# Patient Record
Sex: Male | Born: 1990
Health system: Southern US, Community
[De-identification: ages and names within clinical notes are randomized; demographics above are authoritative.]

## PROBLEM LIST (undated history)

## (undated) DIAGNOSIS — IMO0001 Reserved for inherently not codable concepts without codable children: Secondary | ICD-10-CM

---
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
## 2010-12-04 ENCOUNTER — Emergency Department (HOSPITAL_COMMUNITY)
Admission: EM | Admit: 2010-12-04 | Discharge: 2010-12-04 | Payer: Self-pay | Source: Home / Self Care | Admitting: Family Medicine

## 2012-03-16 ENCOUNTER — Emergency Department (HOSPITAL_COMMUNITY): Payer: 59

## 2012-03-16 ENCOUNTER — Emergency Department (HOSPITAL_COMMUNITY)
Admission: EM | Admit: 2012-03-16 | Discharge: 2012-03-16 | Disposition: A | Discharge status: Home / Self Care | Payer: 59 | Attending: Emergency Medicine | Admitting: Emergency Medicine

## 2012-03-16 ENCOUNTER — Encounter (HOSPITAL_COMMUNITY): Payer: Self-pay | Admitting: Emergency Medicine

## 2012-03-16 DIAGNOSIS — M25529 Pain in unspecified elbow: Secondary | ICD-10-CM | POA: Insufficient documentation

## 2012-03-16 DIAGNOSIS — S0081XA Abrasion of other part of head, initial encounter: Secondary | ICD-10-CM

## 2012-03-16 DIAGNOSIS — R079 Chest pain, unspecified: Secondary | ICD-10-CM | POA: Insufficient documentation

## 2012-03-16 DIAGNOSIS — M25429 Effusion, unspecified elbow: Secondary | ICD-10-CM | POA: Insufficient documentation

## 2012-03-16 DIAGNOSIS — IMO0001 Reserved for inherently not codable concepts without codable children: Secondary | ICD-10-CM | POA: Insufficient documentation

## 2012-03-16 DIAGNOSIS — IMO0002 Reserved for concepts with insufficient information to code with codable children: Secondary | ICD-10-CM | POA: Insufficient documentation

## 2012-03-16 DIAGNOSIS — M25519 Pain in unspecified shoulder: Secondary | ICD-10-CM | POA: Insufficient documentation

## 2012-03-16 DIAGNOSIS — S060X1A Concussion with loss of consciousness of 30 minutes or less, initial encounter: Secondary | ICD-10-CM | POA: Insufficient documentation

## 2012-03-16 DIAGNOSIS — F10929 Alcohol use, unspecified with intoxication, unspecified: Secondary | ICD-10-CM

## 2012-03-16 DIAGNOSIS — F1010 Alcohol abuse, uncomplicated: Secondary | ICD-10-CM | POA: Insufficient documentation

## 2012-03-16 DIAGNOSIS — R51 Headache: Secondary | ICD-10-CM | POA: Insufficient documentation

## 2012-03-16 HISTORY — DX: Reserved for inherently not codable concepts without codable children: IMO0001

## 2012-03-16 LAB — CBC
HCT: 44.8 % (ref 39.0–52.0)
Hemoglobin: 16.3 g/dL (ref 13.0–17.0)
MCH: 31.1 pg (ref 26.0–34.0)
MCHC: 36.4 g/dL — ABNORMAL HIGH (ref 30.0–36.0)
MCV: 85.5 fL (ref 78.0–100.0)
RBC: 5.24 MIL/uL (ref 4.22–5.81)
RDW: 12.5 % (ref 11.5–15.5)
WBC: 12 10*3/uL — ABNORMAL HIGH (ref 4.0–10.5)

## 2012-03-16 LAB — DIFFERENTIAL
Basophils Absolute: 0 10*3/uL (ref 0.0–0.1)
Basophils Relative: 0 % (ref 0–1)
Eosinophils Absolute: 0 10*3/uL (ref 0.0–0.7)
Eosinophils Relative: 0 % (ref 0–5)
Lymphocytes Relative: 15 % (ref 12–46)
Monocytes Absolute: 0.7 10*3/uL (ref 0.1–1.0)
Monocytes Relative: 6 % (ref 3–12)
Neutro Abs: 9.5 10*3/uL — ABNORMAL HIGH (ref 1.7–7.7)
Neutrophils Relative %: 79 % — ABNORMAL HIGH (ref 43–77)

## 2012-03-16 LAB — BASIC METABOLIC PANEL
CO2: 24 mEq/L (ref 19–32)
Chloride: 99 mEq/L (ref 96–112)
Creatinine, Ser: 0.83 mg/dL (ref 0.50–1.35)
GFR calc Af Amer: >90 mL/min (ref >90)
Glucose, Bld: 100 mg/dL — ABNORMAL HIGH (ref 70–99)
Potassium: 3.4 mEq/L — ABNORMAL LOW (ref 3.5–5.1)
Sodium: 135 mEq/L (ref 135–145)

## 2012-03-16 LAB — ETHANOL: Alcohol, Ethyl (B): 140 mg/dL — ABNORMAL HIGH (ref 0–11)

## 2012-03-16 MED ORDER — ONDANSETRON HCL 4 MG/2ML IJ SOLN
4.0000 mg | Freq: Once | INTRAMUSCULAR | Status: AC
  Administered 2012-03-16: 4 mg via INTRAVENOUS
  Filled 2012-03-16: qty 2

## 2012-03-16 MED ORDER — FENTANYL CITRATE 0.05 MG/ML IJ SOLN
25.0000 ug | Freq: Once | INTRAMUSCULAR | Status: AC
  Administered 2012-03-16: 08:00:00 via INTRAVENOUS
  Filled 2012-03-16: qty 2

## 2012-03-16 MED ORDER — IOHEXOL 300 MG/ML  SOLN
100.0000 mL | Freq: Once | INTRAMUSCULAR | Status: AC | PRN
  Administered 2012-03-16: 100 mL via INTRAVENOUS

## 2012-03-16 MED ORDER — HYDROMORPHONE HCL PF 1 MG/ML IJ SOLN
0.5000 mg | Freq: Once | INTRAMUSCULAR | Status: AC
  Administered 2012-03-16: 09:00:00 via INTRAVENOUS
  Filled 2012-03-16: qty 1

## 2012-03-16 MED ORDER — CYCLOBENZAPRINE HCL 10 MG PO TABS: 10.0000 mg | ORAL_TABLET | Freq: Two times a day (BID) | ORAL | Status: AC | PRN

## 2012-03-16 MED ORDER — SODIUM CHLORIDE 0.9 % IV BOLUS (SEPSIS)
1000.0000 mL | Freq: Once | INTRAVENOUS | Status: AC
  Administered 2012-03-16: 1000 mL via INTRAVENOUS

## 2012-03-16 MED ORDER — IBUPROFEN 600 MG PO TABS: 600.0000 mg | ORAL_TABLET | Freq: Four times a day (QID) | ORAL | Status: AC | PRN

## 2012-03-16 NOTE — ED Notes (Addendum)
Pt given water to drink. Verified with PA.

## 2012-03-16 NOTE — ED Notes (Signed)
Patient remains in CT and Xray at this time
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
## 2012-03-16 NOTE — ED Notes (Signed)
Enter patient room and noticed C-collar located at foot of bed.  Patient stated that he removed it himself.  Head of bed elevated at this time.  RN Pati Gallo informed and PA B. Laveda Norman informed.  Family remains at bedside; patient tearful at this time; Patient reports pain in left shoulder and bilateral knees at this time

## 2012-03-16 NOTE — ED Provider Notes (Signed)
Medical screening examination/treatment/procedure(s) were performed by non-physician practitioner and as supervising physician I was immediately available for consultation/collaboration.  Sharrie Self K Meshawn Oconnor-Rasch, MD 03/16/12 [id number] 
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
## 2012-03-16 NOTE — ED Notes (Signed)
Patient transported to CT and X ray 

## 2012-03-16 NOTE — ED Notes (Signed)
PA in to turn patient and take off of back board; C-collar remains in place

## 2012-03-16 NOTE — ED Provider Notes (Signed)
Medical screening examination/treatment/procedure(s) were performed by non-physician practitioner and as supervising physician I was immediately available for consultation/collaboration.  Jasmine Awe, MD 03/16/12 [phone]
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
## 2012-03-16 NOTE — ED Notes (Signed)
Patient back from CT; currently resting quietly in bed; no respiratory or acute distress noted.  Family and [employer] now present at bedside.  Will continue to monitor.
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
## 2018-10-30 ENCOUNTER — Ambulatory Visit (INDEPENDENT_AMBULATORY_CARE_PROVIDER_SITE_OTHER): Payer: BLUE CROSS/BLUE SHIELD | Admitting: Family Medicine

## 2018-10-30 ENCOUNTER — Encounter: Payer: Self-pay | Admitting: Family Medicine

## 2018-10-30 ENCOUNTER — Other Ambulatory Visit: Payer: Self-pay

## 2018-10-30 VITALS — BP 118/64 | HR 56 | Temp 98.5°F | Resp 14 | Ht 73.0 in | Wt 168.0 lb

## 2018-10-30 DIAGNOSIS — G8929 Other chronic pain: Secondary | ICD-10-CM

## 2018-10-30 DIAGNOSIS — Z23 Encounter for immunization: Secondary | ICD-10-CM | POA: Diagnosis not present

## 2018-10-30 DIAGNOSIS — R252 Cramp and spasm: Secondary | ICD-10-CM | POA: Diagnosis not present

## 2018-10-30 DIAGNOSIS — M545 Low back pain, unspecified: Secondary | ICD-10-CM

## 2018-10-30 DIAGNOSIS — Z0000 Encounter for general adult medical examination without abnormal findings: Secondary | ICD-10-CM

## 2018-10-30 DIAGNOSIS — Z114 Encounter for screening for human immunodeficiency virus [HIV]: Secondary | ICD-10-CM | POA: Diagnosis not present

## 2018-10-30 DIAGNOSIS — Z87820 Personal history of traumatic brain injury: Secondary | ICD-10-CM

## 2018-10-30 DIAGNOSIS — Z202 Contact with and (suspected) exposure to infections with a predominantly sexual mode of transmission: Secondary | ICD-10-CM | POA: Diagnosis not present

## 2018-10-30 DIAGNOSIS — Z13220 Encounter for screening for lipoid disorders: Secondary | ICD-10-CM | POA: Diagnosis not present

## 2018-10-30 NOTE — Patient Instructions (Signed)
We will set you up for xray I recommend dental visit every 6 months Goal is to  Exercise 30 minutes 5 days a week We will send a letter with lab results  Tetantus booster given F/U pending results
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
## 2018-10-31 LAB — LIPID PANEL
CHOL/HDL RATIO: 2 (calc) (ref <5.0)
CHOLESTEROL: 125 mg/dL (ref <200)
HDL: 61 mg/dL (ref >40)
LDL Cholesterol (Calc): 49 mg/dL (calc)
Non-HDL Cholesterol (Calc): 64 mg/dL (calc) (ref <130)
TRIGLYCERIDES: 71 mg/dL (ref <150)

## 2018-10-31 LAB — COMPREHENSIVE METABOLIC PANEL
AG RATIO: 1.9 (calc) (ref 1.0–2.5)
ALBUMIN MSPROF: 4.8 g/dL (ref 3.6–5.1)
ALKALINE PHOSPHATASE (APISO): 78 U/L (ref 40–115)
ALT: 24 U/L (ref 9–46)
AST: 27 U/L (ref 10–40)
BUN: 24 mg/dL (ref 7–25)
CHLORIDE: 102 mmol/L (ref 98–110)
CO2: 30 mmol/L (ref 20–32)
Calcium: 9.8 mg/dL (ref 8.6–10.3)
Creat: 0.97 mg/dL (ref 0.60–1.35)
GLOBULIN: 2.5 g/dL (ref 1.9–3.7)
Glucose, Bld: 80 mg/dL (ref 65–99)
POTASSIUM: 5.1 mmol/L (ref 3.5–5.3)
SODIUM: 139 mmol/L (ref 135–146)
Total Bilirubin: 0.7 mg/dL (ref 0.2–1.2)
Total Protein: 7.3 g/dL (ref 6.1–8.1)

## 2018-10-31 LAB — CBC WITH DIFFERENTIAL/PLATELET
Basophils Absolute: 51 cells/uL (ref 0–200)
Basophils Relative: 1.1 %
Eosinophils Absolute: 78 cells/uL (ref 15–500)
Eosinophils Relative: 1.7 %
HCT: 49.1 % (ref 38.5–50.0)
Hemoglobin: 17.1 g/dL (ref 13.2–17.1)
Lymphs Abs: 1509 cells/uL (ref 850–3900)
MCH: 29.8 pg (ref 27.0–33.0)
MCHC: 34.8 g/dL (ref 32.0–36.0)
MCV: 85.7 fL (ref 80.0–100.0)
MPV: 10.4 fL (ref 7.5–12.5)
Monocytes Relative: 9.5 %
Neutro Abs: 2525 cells/uL (ref 1500–7800)
Neutrophils Relative %: 54.9 %
Platelets: 260 10*3/uL (ref 140–400)
RBC: 5.73 10*6/uL (ref 4.20–5.80)
RDW: 12.7 % (ref 11.0–15.0)
Total Lymphocyte: 32.8 %
WBC mixed population: 437 cells/uL (ref 200–950)
WBC: 4.6 10*3/uL (ref 3.8–10.8)

## 2018-10-31 LAB — HIV ANTIBODY (ROUTINE TESTING W REFLEX): HIV 1&2 Ab, 4th Generation: NONREACTIVE

## 2018-10-31 LAB — MAGNESIUM: MAGNESIUM: 2.2 mg/dL (ref 1.5–2.5)

## 2018-10-31 NOTE — Addendum Note (Signed)
Addended by: Milinda AntisURHAM, KAWANTA F on: 10/31/2018 08:16 AM   Modules accepted: Orders

## 2018-11-01 ENCOUNTER — Encounter: Payer: Self-pay | Admitting: Family Medicine

## 2019-10-27 ENCOUNTER — Other Ambulatory Visit: Payer: Self-pay

## 2019-10-27 DIAGNOSIS — Z20822 Contact with and (suspected) exposure to covid-19: Secondary | ICD-10-CM

## 2019-10-28 LAB — NOVEL CORONAVIRUS, NAA: SARS-CoV-2, NAA: NOT DETECTED

## 2019-12-10 ENCOUNTER — Encounter (HOSPITAL_COMMUNITY): Payer: Self-pay | Admitting: Emergency Medicine

## 2019-12-10 ENCOUNTER — Emergency Department (HOSPITAL_COMMUNITY)
Admission: EM | Admit: 2019-12-10 | Discharge: 2019-12-10 | Disposition: A | Discharge status: Home / Self Care | Payer: 59 | Attending: Emergency Medicine | Admitting: Emergency Medicine

## 2019-12-10 ENCOUNTER — Other Ambulatory Visit: Payer: Self-pay

## 2019-12-10 ENCOUNTER — Emergency Department (HOSPITAL_COMMUNITY): Payer: 59

## 2019-12-10 DIAGNOSIS — J029 Acute pharyngitis, unspecified: Secondary | ICD-10-CM

## 2019-12-10 DIAGNOSIS — Y93H3 Activity, building and construction: Secondary | ICD-10-CM | POA: Diagnosis not present

## 2019-12-10 DIAGNOSIS — Y990 Civilian activity done for income or pay: Secondary | ICD-10-CM | POA: Insufficient documentation

## 2019-12-10 DIAGNOSIS — W278XXA Contact with other nonpowered hand tool, initial encounter: Secondary | ICD-10-CM | POA: Insufficient documentation

## 2019-12-10 DIAGNOSIS — T189XXA Foreign body of alimentary tract, part unspecified, initial encounter: Secondary | ICD-10-CM | POA: Diagnosis present

## 2019-12-10 DIAGNOSIS — F17290 Nicotine dependence, other tobacco product, uncomplicated: Secondary | ICD-10-CM | POA: Diagnosis not present

## 2019-12-10 DIAGNOSIS — R070 Pain in throat: Secondary | ICD-10-CM | POA: Diagnosis not present

## 2019-12-10 DIAGNOSIS — Y9261 Building [any] under construction as the place of occurrence of the external cause: Secondary | ICD-10-CM | POA: Insufficient documentation

## 2019-12-10 DIAGNOSIS — Z79899 Other long term (current) drug therapy: Secondary | ICD-10-CM | POA: Diagnosis not present

## 2019-12-10 MED ORDER — LIDOCAINE VISCOUS HCL 2 % MT SOLN
15.0000 mL | Freq: Once | OROMUCOSAL | Status: AC
  Administered 2019-12-10: 15 mL via OROMUCOSAL
  Filled 2019-12-10: qty 15

## 2019-12-10 NOTE — ED Triage Notes (Signed)
Pt accidentally swallowed a 1 inch roofing nail that he was holding in his teeth just pta.  Reports R sided facial tingling PTA that has resolved.  States he thinks he was anxious.  Also reports throat pain.

## 2019-12-10 NOTE — ED Notes (Signed)
Dr.Plunkett notified of pt and orders received. 

## 2019-12-10 NOTE — ED Provider Notes (Signed)
[facility] EMERGENCY DEPARTMENT Provider Note   CSN: [id number] Arrival date & time: 12/10/19  1616     History Chief Complaint  Patient presents with  . Swallowed Foreign Body    Edward Rice is a 29 y.o. male.  Edward Rice is a 29 y.o. male who is otherwise healthy, presents to the ED after he accidentally swallowed a 1 inch roofing nail.  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  He immediately tried to reach back into his throat and get it and then made himself vomit several times but he is not sure if he saw the nail in his vomit.  He then became very anxious and concerned, noted some tingling in his face on the way over here but this is resolved and he thinks it was primarily related to anxiety.  He states that his throat is still a bit sore.  He did not notice any blood in his vomit and he denies any chest pain, shortness of breath or abdominal pain.  No other aggravating or alleviating factors.        Past Medical History:  Diagnosis Date  . No significant past medical history     Patient Active Problem List   Diagnosis Date Noted  . No significant past medical history     History reviewed. No pertinent surgical history.     Family History  Problem Relation Age of Onset  . Diabetes Mother   . ADD / ADHD Father   . Alcohol abuse Father   . Anxiety disorder Father   . Arthritis Father   . Cancer Father 2       prostate  . Depression Father   . Vision loss Father   . Graves' disease Father   . Hyperlipidemia Father   . Anxiety disorder Sister     Social History   Tobacco Use  . Smoking status: Never Smoker  . Smokeless tobacco: Current User    Types: Chew  Substance Use Topics  . Alcohol use: Not Currently  . Drug use: Never    Home Medications Prior to Admission medications   Medication Sig  Start Date End Date Taking? Authorizing Provider  ferrous sulfate 325 (65 FE) MG tablet Take 325 mg by mouth daily with breakfast.    [provider]  Potassium 99 MG TABS Take by mouth.    [provider]  vitamin C (ASCORBIC ACID) 500 MG tablet Take 500 mg by mouth daily.    [provider]    Allergies    Penicillins  Review of Systems   Review of Systems  Constitutional: Negative for chills and fever.  HENT: Positive for sore throat. Negative for trouble swallowing.   Respiratory: Negative for cough and shortness of breath.   Cardiovascular: Negative for chest pain.  Gastrointestinal: Negative for abdominal pain.  Skin: Negative for color change and rash.  Psychiatric/Behavioral: The patient is nervous/anxious.     Physical Exam Updated Vital Signs BP (!) 131/58 (BP Location: Right Arm)   Pulse 66   Temp 98.1 F (36.7 C) (Oral)   Resp 14   SpO2 97%   Physical Exam Vitals and nursing note reviewed.  Constitutional:      General: He is not in acute distress.    Appearance: Normal appearance. He is well-developed and normal weight. He  is not diaphoretic.     Comments: Well-appearing and in no distress  HENT:     Head: Normocephalic and atraumatic.     Mouth/Throat:     Mouth: Mucous membranes are moist.     Pharynx: Oropharynx is clear. Posterior oropharyngeal erythema present.     Comments: Posterior oropharynx is clear, mild erythema noted, no swelling, able to speak with normal phonation, tolerating secretions without difficulty Eyes:     General:        Right eye: No discharge.        Left eye: No discharge.  Neck:     Comments: No stridor Cardiovascular:     Rate and Rhythm: Normal rate and regular rhythm.     Pulses: Normal pulses.     Heart sounds: Normal heart sounds.  Pulmonary:     Effort: Pulmonary effort is normal. No respiratory distress.     Breath sounds: Normal breath sounds.     Comments: Respirations equal and  unlabored, patient able to speak in full sentences, lungs clear to auscultation bilaterally Abdominal:     General: Abdomen is flat. Bowel sounds are normal. There is no distension.     Palpations: Abdomen is soft. There is no mass.     Tenderness: There is no abdominal tenderness. There is no guarding.     Comments: Abdomen soft, nondistended, nontender to palpation in all quadrants without guarding or peritoneal signs  Musculoskeletal:     Cervical back: Neck supple.  Skin:    General: Skin is warm and dry.  Neurological:     Mental Status: He is alert.     Coordination: Coordination normal.  Psychiatric:        Mood and Affect: Mood normal.        Behavior: Behavior normal.     ED Results / Procedures / Treatments   Labs (all labs ordered are listed, but only abnormal results are displayed) Labs Reviewed - No data to display  EKG None  Radiology DG Neck Soft Tissue  Result Date: 12/10/2019 CLINICAL DATA:  Swallowed 1 inj roofing nail. EXAM: NECK SOFT TISSUES - 1+ VIEW COMPARISON:  None. FINDINGS: There is no evidence of retropharyngeal soft tissue swelling or epiglottic enlargement. The cervical airway is unremarkable and no radio-opaque foreign body identified. IMPRESSION: Negative. Electronically Signed   By: Virgina Norfolk M.D.   On: 12/10/2019 17:06   DG Chest 1 View  Result Date: 12/10/2019 CLINICAL DATA:  Swallowed 1 in true male. EXAM: CHEST  1 VIEW COMPARISON:  None. FINDINGS: The heart size and mediastinal contours are within normal limits. Both lungs are clear. The visualized skeletal structures are unremarkable. IMPRESSION: No active disease. Electronically Signed   By: Virgina Norfolk M.D.   On: 12/10/2019 17:07   DG Abdomen 1 View  Result Date: 12/10/2019 CLINICAL DATA:  Swallowed 1 intra roofing nail. EXAM: ABDOMEN - 1 VIEW COMPARISON:  None. FINDINGS: The bowel gas pattern is normal. There is no evidence of free air. No radio-opaque calculi or other  significant radiographic abnormality are seen. IMPRESSION: Negative. Electronically Signed   By: Virgina Norfolk M.D.   On: 12/10/2019 17:07    Procedures Procedures (including critical care time)  Medications Ordered in ED Medications  lidocaine (XYLOCAINE) 2 % viscous mouth solution 15 mL (has no administration in time range)    ED Course  I have reviewed the triage vital signs and the nursing notes.  Pertinent labs & imaging results that were available  during my care of the patient were reviewed by me and considered in my medical decision making (see chart for details).    MDM Rules/Calculators/A&P                     29 year old male presents after he accidentally swallowed a roofing nail, he made himself vomit immediately afterwards, currently complaining of mild sore throat but no other complaints.  He denies any chest pain or shortness of breath.  X-rays of the neck, chest and abdomen show no metallic foreign bodies, no free air noted to suggest perforation.  He has a small amount of erythema in the posterior oropharynx, from nail or vomiting likely, will treat with viscous lidocaine and warm salt water gargles, given reassuring imaging feel patient is stable for discharge home at this time.  Return precautions discussed.  Patient expresses understanding and agreement with plan.  Final Clinical Impression(s) / ED Diagnoses Final diagnoses:  Swallowed foreign body, initial encounter  Sore throat    Rx / DC Orders ED Discharge Orders    None       Legrand Rams 12/10/19 1837    Raeford Razor, MD 12/10/19 1905

## 2019-12-10 NOTE — ED Notes (Signed)
The pt swallowed a 1 inch roofing nail approx 1600   He haD PAIN IN HIS THROAT JUST AFTER  HE WAS ROOFING AND WAS HOLDING THE NAIL IN HIS MOUTH.  HE VOMITING SEVERAL TIMES AFTERWARD VOLUNTARILY   HE STILL HAS A SORE THROAT.

## 2019-12-10 NOTE — Discharge Instructions (Signed)
I'm reassured that her x-rays do not show any foreign body, and it appears that you vomited up prior to arrival.  You may continue to have some sore throat over the next 2 days use warm salt water gargles and's epical throat lozenges or Chloraseptic throat spray.  If you develop any worsening sore throat, chest or abdominal pain or any other new or concerning symptoms return to the ED for reevaluation.

## 2021-09-10 IMAGING — CR DG CHEST 1V
1 series · 1 of 1 positions shown · non-contrast
Comparison: None.
COMPARISON: None.

Addendum:
CLINICAL DATA: Swallowed 1 in true female.

EXAM:
CHEST  1 VIEW
CLINICAL DATA: Swallowed 1 inch roofing nail.
*** End of Addendum ***

[chest pa]
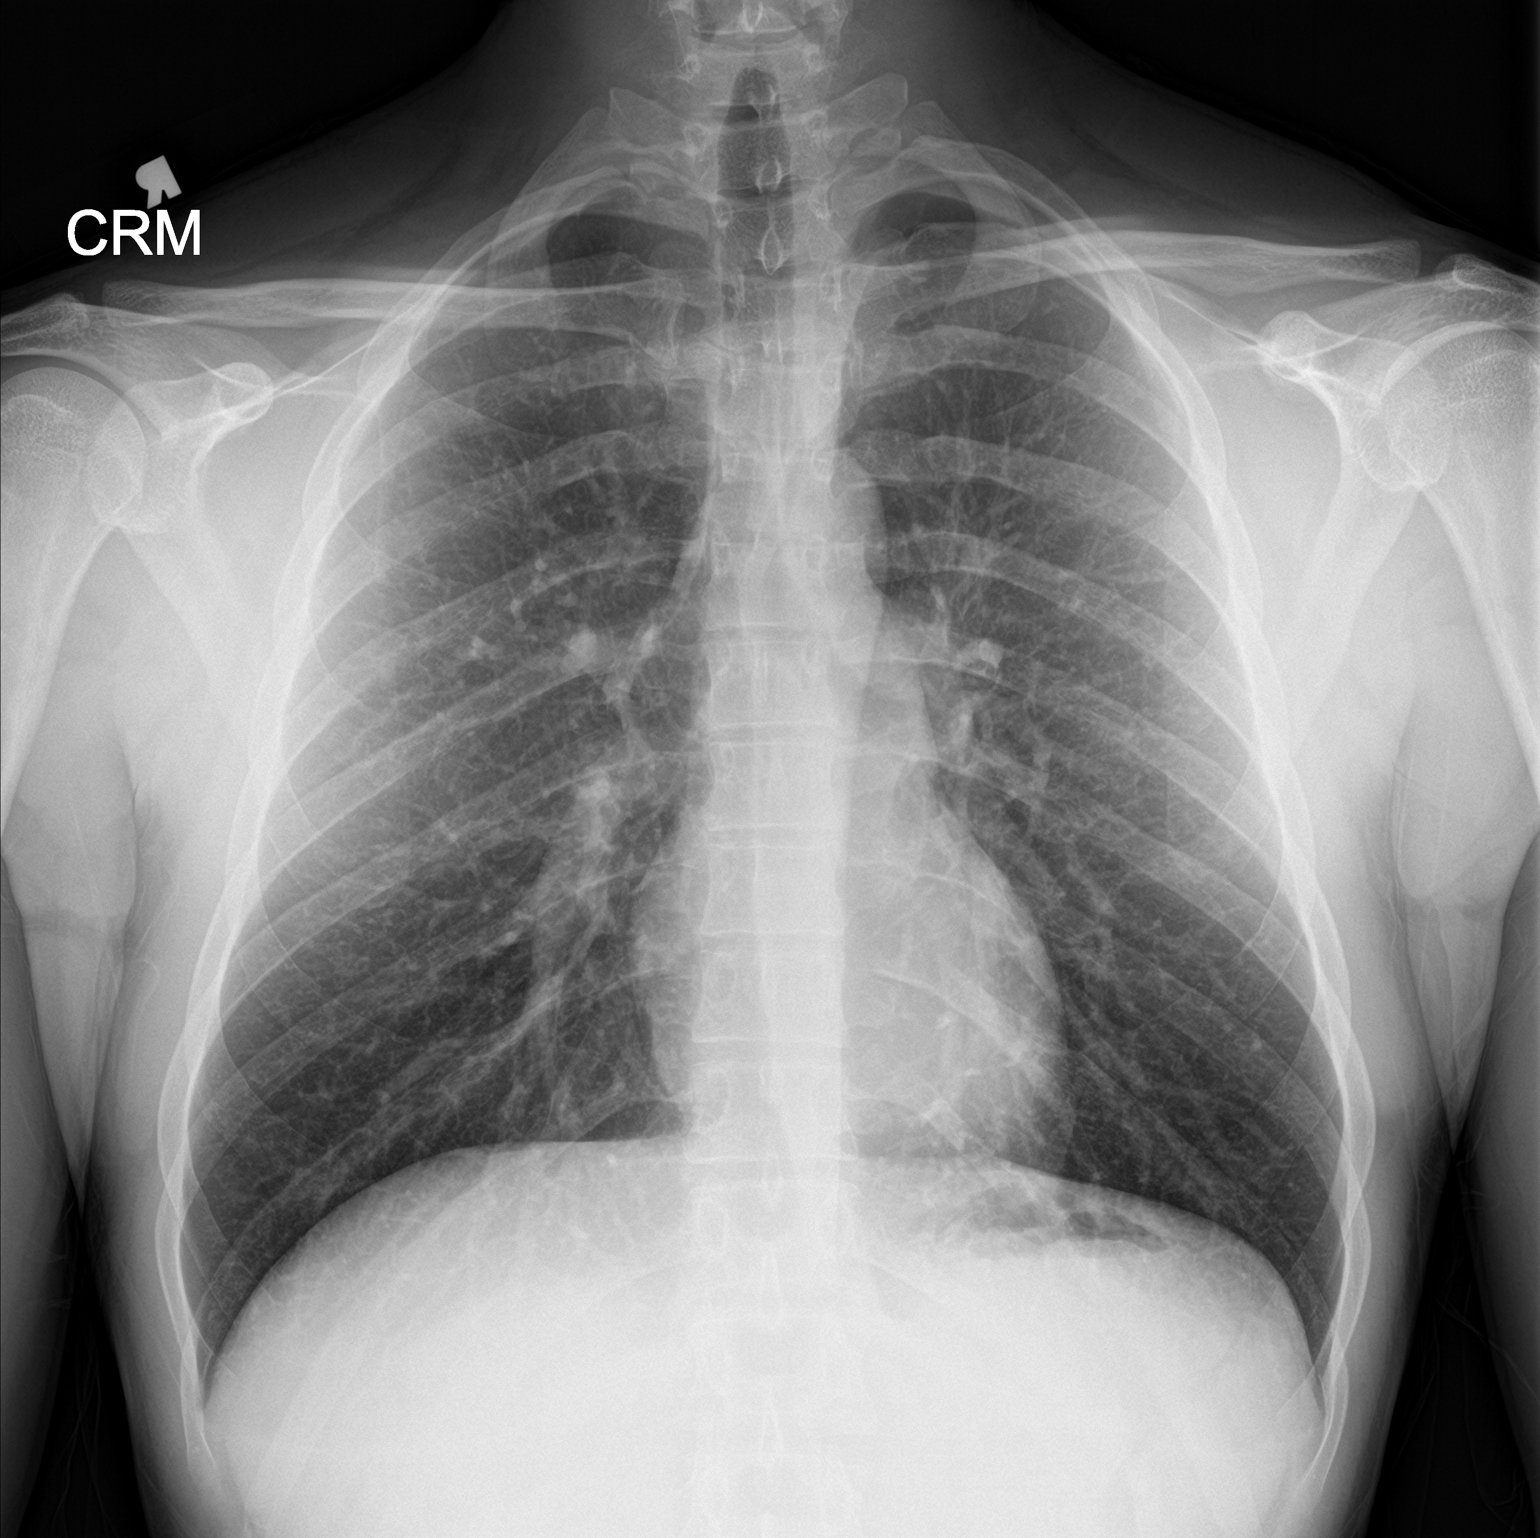

[1 of 1 positions shown; findings below may reference images not displayed]

FINDINGS: The heart size and mediastinal contours are within normal limits.
Both lungs are clear. The visualized skeletal structures are
unremarkable.
IMPRESSION: No active disease.

ADDENDUM:
FINDINGS: The heart size and mediastinal contours are within normal limits.
Both lungs are clear. The visualized skeletal structures are
unremarkable.
IMPRESSION: No active disease.

## 2022-08-27 ENCOUNTER — Encounter: Payer: Self-pay | Admitting: Emergency Medicine

## 2022-08-27 ENCOUNTER — Ambulatory Visit
Admission: EM | Admit: 2022-08-27 | Discharge: 2022-08-27 | Disposition: A | Discharge status: Home / Self Care | Payer: No Typology Code available for payment source | Attending: Family Medicine | Admitting: Family Medicine

## 2022-08-27 DIAGNOSIS — J209 Acute bronchitis, unspecified: Secondary | ICD-10-CM

## 2022-08-27 MED ORDER — PREDNISONE 20 MG PO TABS: 40.0000 mg | ORAL_TABLET | Freq: Every day | ORAL | 0 refills | Status: DC

## 2022-08-27 MED ORDER — PROMETHAZINE-DM 6.25-15 MG/5ML PO SYRP: 5.0000 mL | ORAL_SOLUTION | Freq: Four times a day (QID) | ORAL | 0 refills | Status: DC | PRN

## 2022-08-27 NOTE — ED Triage Notes (Signed)
Patient c/o non-productive cough x 6 days, some congestion, afebrile.  Patient denies any OTC meds.

## 2022-08-27 NOTE — ED Provider Notes (Signed)
[facility]    CSN: [id number] Arrival date & time: 08/27/22  1431      History   Chief Complaint Chief Complaint  Patient presents with   Cough    HPI Edward Rice is a 31 y.o. male.   Patient presenting today with almost a week of dry hacking cough, nasal congestion.  Denies fever, chills, chest pain, shortness of breath, abdominal pain, nausea vomiting or diarrhea.  So far not trying anything over-the-counter for symptoms.  States he used to have a history of seasonal allergies but does not anymore and does not take anything for this.  No chronic pulmonary disease.  No known sick contacts that he is aware of.    Past Medical History:  Diagnosis Date   No significant past medical history     Patient Active Problem List   Diagnosis Date Noted   No significant past medical history     History reviewed. No pertinent surgical history.     Home Medications    Prior to Admission medications   Medication Sig Start Date End Date Taking? Authorizing Provider  predniSONE (DELTASONE) 20 MG tablet Take 2 tablets (40 mg total) by mouth daily with breakfast. 08/27/22  Yes Volney American, PA-C  promethazine-dextromethorphan (PROMETHAZINE-DM) 6.25-15 MG/5ML syrup Take 5 mLs by mouth 4 (four) times daily as needed. 08/27/22  Yes Volney American, PA-C  ferrous sulfate 325 (65 FE) MG tablet Take 325 mg by mouth daily with breakfast.    [provider]  Potassium 99 MG TABS Take by mouth.    [provider]  vitamin C (ASCORBIC ACID) 500 MG tablet Take 500 mg by mouth daily.    [provider]    Family History Family History  Problem Relation Age of Onset   Diabetes Mother    ADD / ADHD Father    Alcohol abuse Father    Anxiety disorder Father    Arthritis Father    Cancer Father 70       prostate   Depression Father    Vision loss Father    Graves' disease Father    Hyperlipidemia Father    Anxiety disorder  Sister     Social History Social History   Tobacco Use   Smoking status: Some Days    Types: Cigarettes   Smokeless tobacco: Current    Types: Chew  Vaping Use   Vaping Use: Every day  Substance Use Topics   Alcohol use: Not Currently   Drug use: Never     Allergies   Penicillins   Review of Systems Review of Systems Per HPI  Physical Exam Triage Vital Signs ED Triage Vitals [08/27/22 1445]  Enc Vitals Group     BP 123/74     Pulse Rate (!) 59     Resp 18     Temp 98.3 F (36.8 C)     Temp Source Oral     SpO2 97 %     Weight 180 lb (81.6 kg)     Height 6' (1.829 m)     Head Circumference      Peak Flow      Pain Score 0     Pain Loc      Pain Edu?      Excl. in [place]?    No data found.  Updated Vital Signs BP 123/74 (BP Location: Right Arm)   Pulse (!) 59   Temp 98.3 F (36.8 C) (Oral)  Resp 18   Ht 6' (1.829 m)   Wt 180 lb (81.6 kg)   SpO2 97%   BMI 24.41 kg/m   Visual Acuity Right Eye Distance:   Left Eye Distance:   Bilateral Distance:    Right Eye Near:   Left Eye Near:    Bilateral Near:     Physical Exam Vitals and nursing note reviewed.  Constitutional:      Appearance: Normal appearance.  HENT:     Head: Atraumatic.     Right Ear: Tympanic membrane normal.     Left Ear: Tympanic membrane normal.     Nose: Rhinorrhea present.     Mouth/Throat:     Mouth: Mucous membranes are moist.     Pharynx: Oropharynx is clear. Posterior oropharyngeal erythema present.  Eyes:     Extraocular Movements: Extraocular movements intact.     Conjunctiva/sclera: Conjunctivae normal.  Cardiovascular:     Rate and Rhythm: Normal rate and regular rhythm.     Heart sounds: Normal heart sounds.  Pulmonary:     Effort: Pulmonary effort is normal. No respiratory distress.     Breath sounds: Normal breath sounds. No wheezing or rales.  Musculoskeletal:        General: Normal range of motion.     Cervical back: Normal range of motion and neck  supple.  Skin:    General: Skin is warm and dry.  Neurological:     General: No focal deficit present.     Mental Status: He is oriented to person, place, and time.     Motor: No weakness.     Gait: Gait normal.  Psychiatric:        Mood and Affect: Mood normal.        Thought Content: Thought content normal.        Judgment: Judgment normal.      UC Treatments / Results  Labs (all labs ordered are listed, but only abnormal results are displayed) Labs Reviewed - No data to display  EKG   Radiology No results found.  Procedures Procedures (including critical care time)  Medications Ordered in UC Medications - No data to display  Initial Impression / Assessment and Plan / UC Course  I have reviewed the triage vital signs and the nursing notes.  Pertinent labs & imaging results that were available during my care of the patient were reviewed by me and considered in my medical decision making (see chart for details).     Consistent with bronchitis, unclear at this time if viral versus allergic.  Recommended starting a good allergy regimen and will treat with prednisone, Phenergan DM, supportive over-the-counter medications and home care.  Return for worsening symptoms.  Final Clinical Impressions(s) / UC Diagnoses   Final diagnoses:  Acute bronchitis, unspecified organism   Discharge Instructions   None    ED Prescriptions     Medication Sig Dispense Auth. Provider   predniSONE (DELTASONE) 20 MG tablet Take 2 tablets (40 mg total) by mouth daily with breakfast. 10 tablet Particia Nearing, PA-C   promethazine-dextromethorphan (PROMETHAZINE-DM) 6.25-15 MG/5ML syrup Take 5 mLs by mouth 4 (four) times daily as needed. 100 mL Particia Nearing, [place]      PDMP not reviewed this encounter.   Particia Nearing, [place] 08/27/22 1549

## 2022-12-20 ENCOUNTER — Ambulatory Visit
Admission: EM | Admit: 2022-12-20 | Discharge: 2022-12-20 | Discharge status: Absent without leave | Payer: No Typology Code available for payment source

## 2022-12-20 ENCOUNTER — Ambulatory Visit
Admission: EM | Admit: 2022-12-20 | Discharge: 2022-12-20 | Disposition: A | Discharge status: Home / Self Care | Payer: No Typology Code available for payment source

## 2022-12-20 DIAGNOSIS — L0100 Impetigo, unspecified: Secondary | ICD-10-CM

## 2022-12-20 MED ORDER — DOXYCYCLINE HYCLATE 100 MG PO CAPS: 100.0000 mg | ORAL_CAPSULE | Freq: Two times a day (BID) | ORAL | 0 refills | Status: DC

## 2022-12-20 MED ORDER — MUPIROCIN 2 % EX OINT: 1.0000 | TOPICAL_OINTMENT | Freq: Two times a day (BID) | CUTANEOUS | 0 refills | Status: AC

## 2022-12-20 NOTE — ED Provider Notes (Signed)
[facility]    CSN: [id number] Arrival date & time: 12/20/22  1546      History   Chief Complaint Chief Complaint  Patient presents with   Sore    HPI BARTT Edward Rice is a 32 y.o. male.   Patient presenting today with 2 to 3-day history of a weeping sore in the nostrils and now a crusted weeping sore to the right side of face under beard that he first noticed this morning.  Denies fever, chills, known injury to the area, body aches.  So far not tried anything over-the-counter for symptoms.  Concerned for staph infection.    Past Medical History:  Diagnosis Date   No significant past medical history     Patient Active Problem List   Diagnosis Date Noted   No significant past medical history     History reviewed. No pertinent surgical history.     Home Medications    Prior to Admission medications   Medication Sig Start Date End Date Taking? Authorizing Provider  Cholecalciferol (VITAMIN D) 125 MCG (5000 UT) CAPS Take by mouth.   Yes [provider]  doxycycline (VIBRAMYCIN) 100 MG capsule Take 1 capsule (100 mg total) by mouth 2 (two) times daily. 12/20/22  Yes Volney American, PA-C  mupirocin ointment (BACTROBAN) 2 % Apply 1 Application topically 2 (two) times daily. 12/20/22  Yes Volney American, PA-C  Omega-3 Fatty Acids (FISH OIL) 500 MG CAPS Take by mouth.   Yes [provider]  ferrous sulfate 325 (65 FE) MG tablet Take 325 mg by mouth daily with breakfast.    [provider]  Potassium 99 MG TABS Take by mouth.    [provider]  predniSONE (DELTASONE) 20 MG tablet Take 2 tablets (40 mg total) by mouth daily with breakfast. 08/27/22   Volney American, PA-C  promethazine-dextromethorphan (PROMETHAZINE-DM) 6.25-15 MG/5ML syrup Take 5 mLs by mouth 4 (four) times daily as needed. 08/27/22   Volney American, PA-C  vitamin C (ASCORBIC ACID) 500 MG tablet Take 500 mg by mouth daily.     [provider]    Family History Family History  Problem Relation Age of Onset   Diabetes Mother    ADD / ADHD Father    Alcohol abuse Father    Anxiety disorder Father    Arthritis Father    Cancer Father 36       prostate   Depression Father    Vision loss Father    Graves' disease Father    Hyperlipidemia Father    Anxiety disorder Sister     Social History Social History   Tobacco Use   Smoking status: Some Days    Types: Cigarettes   Smokeless tobacco: Current    Types: Chew  Vaping Use   Vaping Use: Every day  Substance Use Topics   Alcohol use: Not Currently   Drug use: Never     Allergies   Penicillins   Review of Systems Review of Systems Per HPI  Physical Exam Triage Vital Signs ED Triage Vitals  Enc Vitals Group     BP 12/20/22 1608 125/62     Pulse Rate 12/20/22 1608 62     Resp 12/20/22 1608 16     Temp 12/20/22 1608 98.1 F (36.7 C)     Temp Source 12/20/22 1608 Oral     SpO2 12/20/22 1608 98 %     Weight --      Height --  Head Circumference --      Peak Flow --      Pain Score 12/20/22 1607 2     Pain Loc --      Pain Edu? --      Excl. in [place]? --    No data found.  Updated Vital Signs BP 125/62 (BP Location: Right Arm)   Pulse 62   Temp 98.1 F (36.7 C) (Oral)   Resp 16   SpO2 98%   Visual Acuity Right Eye Distance:   Left Eye Distance:   Bilateral Distance:    Right Eye Near:   Left Eye Near:    Bilateral Near:     Physical Exam Vitals and nursing note reviewed.  Constitutional:      Appearance: Normal appearance.  HENT:     Head: Atraumatic.  Eyes:     Extraocular Movements: Extraocular movements intact.     Conjunctiva/sclera: Conjunctivae normal.  Cardiovascular:     Rate and Rhythm: Normal rate and regular rhythm.  Pulmonary:     Effort: Pulmonary effort is normal.     Breath sounds: Normal breath sounds.  Musculoskeletal:        General: Normal range of motion.     Cervical back:  Normal range of motion and neck supple.  Skin:    General: Skin is warm.     Comments: Yellow crusted oozing sores to base of nostrils and right jawline under beard  Neurological:     General: No focal deficit present.     Mental Status: He is oriented to person, place, and time.  Psychiatric:        Mood and Affect: Mood normal.        Thought Content: Thought content normal.        Judgment: Judgment normal.      UC Treatments / Results  Labs (all labs ordered are listed, but only abnormal results are displayed) Labs Reviewed - No data to display  EKG   Radiology No results found.  Procedures Procedures (including critical care time)  Medications Ordered in UC Medications - No data to display  Initial Impression / Assessment and Plan / UC Course  I have reviewed the triage vital signs and the nursing notes.  Pertinent labs & imaging results that were available during my care of the patient were reviewed by me and considered in my medical decision making (see chart for details).     Consistent with spreading impetigo, treat with doxycycline, mupirocin, good home wound care.  Return for worsening symptoms.  Final Clinical Impressions(s) / UC Diagnoses   Final diagnoses:  Impetigo   Discharge Instructions   None    ED Prescriptions     Medication Sig Dispense Auth. Provider   doxycycline (VIBRAMYCIN) 100 MG capsule Take 1 capsule (100 mg total) by mouth 2 (two) times daily. 14 capsule Volney American, [place]   mupirocin ointment (BACTROBAN) 2 % Apply 1 Application topically 2 (two) times daily. 22 g Volney American, [place]      PDMP not reviewed this encounter.   Volney American, [place] 12/20/22 1701

## 2022-12-20 NOTE — ED Triage Notes (Signed)
Pt reports a sore in the nose x 2-3 day; a painful sore in the face since this morning. Pt is concern for Staph infection.

## 2023-01-12 ENCOUNTER — Ambulatory Visit
Admission: EM | Admit: 2023-01-12 | Discharge: 2023-01-12 | Disposition: A | Discharge status: Home / Self Care | Payer: No Typology Code available for payment source | Attending: Nurse Practitioner | Admitting: Nurse Practitioner

## 2023-01-12 DIAGNOSIS — L0100 Impetigo, unspecified: Secondary | ICD-10-CM | POA: Diagnosis present

## 2023-01-12 DIAGNOSIS — R21 Rash and other nonspecific skin eruption: Secondary | ICD-10-CM | POA: Insufficient documentation

## 2023-01-12 MED ORDER — SULFAMETHOXAZOLE-TRIMETHOPRIM 800-160 MG PO TABS: 1.0000 | ORAL_TABLET | Freq: Two times a day (BID) | ORAL | 0 refills | Status: AC

## 2023-01-12 NOTE — ED Triage Notes (Signed)
Pt was seen here in [place] for possible impetigo, patient now is having new spots on face and right arm and spot in his right nostril. Pt took antibiotics and states it got better then once stopped it got worse and spread more and to his arm and more spots on his face.

## 2023-01-12 NOTE — Discharge Instructions (Addendum)
Your wound culture is pending this confirms the type of bacteria and if the treatment is effective. You have been prescribed Bactrim twice day for 5 days.  Encourage completion of your treatment even when symptoms improve.  Good hand hygiene .

## 2023-01-12 NOTE — ED Provider Notes (Signed)
[facility]    CSN: [id number] Arrival date & time: 01/12/23  1356      History   Chief Complaint Chief Complaint  Patient presents with   Rash    HPI Edward Rice is a 32 y.o. male.   HPI  He is today for recurrent rash after recent treatment with Doxycyline and Bactroban for impetigo on 12/20/22. He reports the rash improved however after the anbx it returned. He felt like the Bactroban made one area worse.  He is concern that he is contagious. 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 He is concern that this may a contributor with the mat use. He has tried to take he necessary precautions.  Past Medical History:  Diagnosis Date   No significant past medical history     Patient Active Problem List   Diagnosis Date Noted   No significant past medical history     History reviewed. No pertinent surgical history.     Home Medications    Prior to Admission medications   Medication Sig Start Date End Date Taking? Authorizing Provider  Cholecalciferol (VITAMIN D) 125 MCG (5000 UT) CAPS Take by mouth.   Yes [provider]  mupirocin ointment (BACTROBAN) 2 % Apply 1 Application topically 2 (two) times daily. 12/20/22  Yes Volney American, PA-C  Omega-3 Fatty Acids (FISH OIL) 500 MG CAPS Take by mouth.   Yes [provider]  Potassium 99 MG TABS Take by mouth.   Yes [provider]  sulfamethoxazole-trimethoprim (BACTRIM DS) 800-160 MG tablet Take 1 tablet by mouth 2 (two) times daily for 7 days. 01/12/23 01/19/23 Yes Vevelyn Francois, NP  vitamin C (ASCORBIC ACID) 500 MG tablet Take 500 mg by mouth daily.   Yes [provider]    Family History Family History  Problem Relation Age of Onset   Diabetes Mother    ADD / ADHD Father    Alcohol abuse Father    Anxiety disorder Father    Arthritis Father    Cancer Father 16       prostate   Depression Father    Vision loss Father    Graves' disease Father     Hyperlipidemia Father    Anxiety disorder Sister     Social History Social History   Tobacco Use   Smoking status: Some Days    Types: Cigarettes   Smokeless tobacco: Current    Types: Chew  Vaping Use   Vaping Use: Every day  Substance Use Topics   Alcohol use: Not Currently   Drug use: Never     Allergies   Penicillins   Review of Systems Review of Systems   Physical Exam Triage Vital Signs ED Triage Vitals [01/12/23 1448]  Enc Vitals Group     BP 127/73     Pulse Rate 70     Resp 18     Temp 97.9 F (36.6 C)     Temp Source Oral     SpO2 93 %     Weight      Height      Head Circumference      Peak Flow      Pain Score 0     Pain Loc      Pain Edu?      Excl. in [place]?    No data found.  Updated Vital Signs BP 127/73 (BP Location: Right Arm)   Pulse 70   Temp 97.9 F (  36.6 C) (Oral)   Resp 18   SpO2 93%   Visual Acuity Right Eye Distance:   Left Eye Distance:   Bilateral Distance:    Right Eye Near:   Left Eye Near:    Bilateral Near:     Physical Exam Constitutional:      General: He is not in acute distress.    Appearance: He is normal weight.  HENT:     Head: Normocephalic and atraumatic.  Cardiovascular:     Rate and Rhythm: Normal rate.  Pulmonary:     Effort: Pulmonary effort is normal.  Musculoskeletal:        General: Normal range of motion.     Cervical back: Normal range of motion.  Skin:    General: Skin is warm.     Capillary Refill: Capillary refill takes less than 2 seconds.     Findings: Lesion (dried, culture pending) and rash present.  Neurological:     General: No focal deficit present.     Mental Status: He is alert and oriented to person, place, and time.  Psychiatric:        Mood and Affect: Mood normal.        Behavior: Behavior normal.     UC Treatments / Results  Labs (all labs ordered are listed, but only abnormal results are displayed) Labs Reviewed  AEROBIC CULTURE W GRAM STAIN (SUPERFICIAL  SPECIMEN)    EKG   Radiology No results found.  Procedures Procedures (including critical care time)  Medications Ordered in UC Medications - No data to display  Initial Impression / Assessment and Plan / UC Course  I have reviewed the triage vital signs and the nursing notes.  Pertinent labs & imaging results that were available during my care of the patient were reviewed by me and considered in my medical decision making (see chart for details).     rash Final Clinical Impressions(s) / UC Diagnoses   Final diagnoses:  Rash  Impetigo  Requsted additional tx    Discharge Instructions      Your wound culture is pending this confirms the type of bacteria and if the treatment is effective. You have been prescribed Bactrim twice day for 5 days.  Encourage completion of your treatment even when symptoms improve.  Good hand hygiene .     ED Prescriptions     Medication Sig Dispense Auth. Provider   sulfamethoxazole-trimethoprim (BACTRIM DS) 800-160 MG tablet Take 1 tablet by mouth 2 (two) times daily for 7 days. 14 tablet Vevelyn Francois, NP      PDMP not reviewed this encounter.   Dionisio David Mosier, [place] 01/12/23 [phone]

## 2023-01-14 LAB — AEROBIC CULTURE W GRAM STAIN (SUPERFICIAL SPECIMEN): Gram Stain: NONE SEEN

## 2023-02-02 ENCOUNTER — Ambulatory Visit: Payer: Self-pay | Admitting: Adult Health

## 2023-02-15 ENCOUNTER — Ambulatory Visit: Payer: Self-pay | Admitting: Adult Health

## 2023-03-19 ENCOUNTER — Ambulatory Visit: Payer: No Typology Code available for payment source | Admitting: Adult Health

## 2023-03-22 ENCOUNTER — Ambulatory Visit: Payer: No Typology Code available for payment source | Admitting: Adult Health

## 2024-02-15 ENCOUNTER — Telehealth: Payer: Self-pay | Admitting: Nurse Practitioner

## 2024-02-15 ENCOUNTER — Ambulatory Visit (INDEPENDENT_AMBULATORY_CARE_PROVIDER_SITE_OTHER)

## 2024-02-15 ENCOUNTER — Ambulatory Visit
Admission: EM | Admit: 2024-02-15 | Discharge: 2024-02-15 | Disposition: A | Discharge status: Home / Self Care | Attending: Nurse Practitioner | Admitting: Nurse Practitioner

## 2024-02-15 DIAGNOSIS — G8929 Other chronic pain: Secondary | ICD-10-CM | POA: Diagnosis not present

## 2024-02-15 DIAGNOSIS — M25511 Pain in right shoulder: Secondary | ICD-10-CM

## 2024-02-15 MED ORDER — CYCLOBENZAPRINE HCL 5 MG PO TABS: 5.0000 mg | ORAL_TABLET | Freq: Every day | ORAL | 0 refills | Status: AC

## 2024-02-15 MED ORDER — DEXAMETHASONE SODIUM PHOSPHATE 10 MG/ML IJ SOLN
10.0000 mg | INTRAMUSCULAR | Status: AC
  Administered 2024-02-15: 10 mg via INTRAMUSCULAR

## 2024-02-15 MED ORDER — KETOROLAC TROMETHAMINE 60 MG/2ML IM SOLN
60.0000 mg | Freq: Once | INTRAMUSCULAR | Status: AC
  Administered 2024-02-15: 60 mg via INTRAMUSCULAR

## 2024-02-15 MED ORDER — PREDNISONE 20 MG PO TABS: 40.0000 mg | ORAL_TABLET | Freq: Every day | ORAL | 0 refills | Status: AC

## 2024-02-15 NOTE — ED Triage Notes (Signed)
 Pt reports after taking a martial arts class a guy was laying on the left side of his ribs and now he has pain with laughing or sneezing x 4 days Has right shoulder pain as well x 1 year

## 2024-02-15 NOTE — Discharge Instructions (Addendum)
 X-ray of the right shoulder is pending.  You will be contacted when the results of the x-ray are received.  You also access to your results via MyChart. You were given injections of Toradol 60 mg and Decadron 10 mg today.  Do not take any additional prednisone or ibuprofen.  You may start the prednisone on 02/16/2024. Recommend the use of ice or heat as needed.  Apply ice for pain or swelling, heat for spasm or stiffness.  Apply for 20 minutes, remove for 1 hour, repeat as needed. Gentle stretching and range of motion exercises to help keep the joints stable. Avoid heavy lifting or strenuous activity with the right upper extremity/shoulder while symptoms persist. As discussed, if symptoms fail to improve after this treatment, it is recommended that you follow-up with orthopedics for further evaluation.  Have given you information for 2 orthopedic offices in this area. Follow-up as needed.

## 2024-02-15 NOTE — Telephone Encounter (Signed)
 Call patient to discuss x-ray results of the right shoulder.  Spoke with patient, verified 2 patient identifiers.  Advised patient that x-ray was negative and did not show any abnormal findings.  Patient advised to continue with current plan of care.  Patient verbalized understanding, all questions were answered.

## 2024-02-15 NOTE — ED Provider Notes (Signed)
 [facility]    CSN: [id number] Arrival date & time: 02/15/24  1119      History   Chief Complaint Chief Complaint  Patient presents with   Rib Injury    HPI Edward Rice is a 33 y.o. male.   The history is provided by the patient.   Patient presents for complaints of right shoulder pain that has been present for the past year.  States pain is in the "scapula."  Patient denies any recent injury or trauma.  States that pain has worsened over the past several days.  He states that the pain radiates into the right neck and endorses intermittent numbness and tingling of the right upper extremity.  States he does have full range of motion of the right shoulder.  States that he feels like he cannot turn his head to the right side as far as he used to.  States that he has taken Advil from time to time for symptoms.  Patient states that he is right-hand dominant, and favors his right side when carrying items or with any activity.  Patient states he has never been seen for his symptoms.  Past Medical History:  Diagnosis Date   No significant past medical history     Patient Active Problem List   Diagnosis Date Noted   No significant past medical history     History reviewed. No pertinent surgical history.     Home Medications    Prior to Admission medications   Medication Sig Start Date End Date Taking? Authorizing Provider  cyclobenzaprine (FLEXERIL) 5 MG tablet Take 1 tablet (5 mg total) by mouth at bedtime. 02/15/24  Yes Leath-Warren, Sadie Haber, NP  predniSONE (DELTASONE) 20 MG tablet Take 2 tablets (40 mg total) by mouth daily with breakfast for 5 days. 02/15/24 02/20/24 Yes Leath-Warren, Sadie Haber, NP  Cholecalciferol (VITAMIN D) 125 MCG (5000 UT) CAPS Take by mouth.    [provider]  mupirocin ointment (BACTROBAN) 2 % Apply 1 Application topically 2 (two) times daily. 12/20/22   Particia Nearing, PA-C  Omega-3 Fatty Acids (FISH OIL) 500 MG  CAPS Take by mouth.    [provider]  Potassium 99 MG TABS Take by mouth.    [provider]  vitamin C (ASCORBIC ACID) 500 MG tablet Take 500 mg by mouth daily.    [provider]    Family History Family History  Problem Relation Age of Onset   Diabetes Mother    ADD / ADHD Father    Alcohol abuse Father    Anxiety disorder Father    Arthritis Father    Cancer Father 50       prostate   Depression Father    Vision loss Father    Graves' disease Father    Hyperlipidemia Father    Anxiety disorder Sister     Social History Social History   Tobacco Use   Smoking status: Some Days    Types: Cigarettes   Smokeless tobacco: Current    Types: Chew  Vaping Use   Vaping status: Every Day  Substance Use Topics   Alcohol use: Not Currently   Drug use: Never     Allergies   Penicillins   Review of Systems Review of Systems Per HPI  Physical Exam Triage Vital Signs ED Triage Vitals  Encounter Vitals Group     BP 02/15/24 1201 129/73     Systolic BP Percentile --  Diastolic BP Percentile --      Pulse Rate 02/15/24 1201 (!) 55     Resp 02/15/24 1201 18     Temp 02/15/24 1201 98.4 F (36.9 C)     Temp Source 02/15/24 1201 Oral     SpO2 02/15/24 1201 97 %     Weight --      Height --      Head Circumference --      Peak Flow --      Pain Score 02/15/24 1200 8     Pain Loc --      Pain Education --      Exclude from Growth Chart --    No data found.  Updated Vital Signs BP 129/73 (BP Location: Right Arm)   Pulse (!) 55   Temp 98.4 F (36.9 C) (Oral)   Resp 18   SpO2 97%   Visual Acuity Right Eye Distance:   Left Eye Distance:   Bilateral Distance:    Right Eye Near:   Left Eye Near:    Bilateral Near:     Physical Exam Vitals and nursing note reviewed.  Constitutional:      General: He is not in acute distress.    Appearance: Normal appearance.  HENT:     Head: Normocephalic.  Eyes:     Extraocular  Movements: Extraocular movements intact.     Pupils: Pupils are equal, round, and reactive to light.  Pulmonary:     Effort: Pulmonary effort is normal.  Musculoskeletal:     Right shoulder: Tenderness (Tenderness noted to the right scapula, trapezius, and rhomboid muscle.) present. No swelling, deformity or bony tenderness. Normal range of motion. Normal strength. Normal pulse.     Cervical back: Normal range of motion.  Skin:    General: Skin is warm and dry.  Neurological:     General: No focal deficit present.     Mental Status: He is alert and oriented to person, place, and time.  Psychiatric:        Mood and Affect: Mood normal.        Behavior: Behavior normal.      UC Treatments / Results  Labs (all labs ordered are listed, but only abnormal results are displayed) Labs Reviewed - No data to display  EKG   Radiology No results found.  Procedures Procedures (including critical care time)  Medications Ordered in UC Medications  ketorolac (TORADOL) injection 60 mg (60 mg Intramuscular Given 02/15/24 1227)  dexamethasone (DECADRON) injection 10 mg (10 mg Intramuscular Given 02/15/24 1227)    Initial Impression / Assessment and Plan / UC Course  I have reviewed the triage vital signs and the nursing notes.  Pertinent labs & imaging results that were available during my care of the patient were reviewed by me and considered in my medical decision making (see chart for details).  X-ray of the right shoulder is pending.  Toradol 60 mg IM administered for pain and inflammation, and Decadron 10 mg for inflammation.  Will start patient on prednisone 40 mg for the next 5 days to help cover for possible nerve impingement given his episodes of numbness and tingling, and cyclobenzaprine 5 mg for muscle spasm and stiffness.  Supportive care recommendations were provided and discussed with the patient to include over-the-counter Tylenol, gentle range of motion exercises, and the use  of ice or heat.  Patient was advised if symptoms fail to improve, it is recommended that he follow-up with orthopedics for  further evaluation.  Patient was in agreement with this plan of care and verbalized understanding.  All questions were answered.  Patient stable for discharge.  Final Clinical Impressions(s) / UC Diagnoses   Final diagnoses:  Chronic right shoulder pain     Discharge Instructions      X-ray of the right shoulder is pending.  You will be contacted when the results of the x-ray are received.  You also access to your results via MyChart. You were given injections of Toradol 60 mg and Decadron 10 mg today.  Do not take any additional prednisone or ibuprofen.  You may start the prednisone on 02/16/2024. Recommend the use of ice or heat as needed.  Apply ice for pain or swelling, heat for spasm or stiffness.  Apply for 20 minutes, remove for 1 hour, repeat as needed. Gentle stretching and range of motion exercises to help keep the joints stable. Avoid heavy lifting or strenuous activity with the right upper extremity/shoulder while symptoms persist. As discussed, if symptoms fail to improve after this treatment, it is recommended that you follow-up with orthopedics for further evaluation.  Have given you information for 2 orthopedic offices in this area. Follow-up as needed.     ED Prescriptions     Medication Sig Dispense Auth. Provider   predniSONE (DELTASONE) 20 MG tablet Take 2 tablets (40 mg total) by mouth daily with breakfast for 5 days. 10 tablet Leath-Warren, Sadie Haber, NP   cyclobenzaprine (FLEXERIL) 5 MG tablet Take 1 tablet (5 mg total) by mouth at bedtime. 20 tablet Leath-Warren, Sadie Haber, NP      PDMP not reviewed this encounter.   Abran Cantor, NP 02/15/24 1241
